# Patient Record
Sex: Female | Born: 1965 | Race: White | Hispanic: No | Marital: Married | Smoking: Never smoker
Health system: Southern US, Community
[De-identification: ages and names within clinical notes are randomized; demographics above are authoritative.]

## PROBLEM LIST (undated history)

## (undated) DIAGNOSIS — F410 Panic disorder [episodic paroxysmal anxiety] without agoraphobia: Secondary | ICD-10-CM

## (undated) DIAGNOSIS — I10 Essential (primary) hypertension: Secondary | ICD-10-CM

## (undated) DIAGNOSIS — T8859XA Other complications of anesthesia, initial encounter: Secondary | ICD-10-CM

## (undated) HISTORY — PX: WISDOM TOOTH EXTRACTION: SHX21

---

## 2019-12-16 ENCOUNTER — Ambulatory Visit: Payer: Self-pay | Attending: Internal Medicine

## 2019-12-16 DIAGNOSIS — Z23 Encounter for immunization: Secondary | ICD-10-CM

## 2019-12-16 NOTE — Progress Notes (Signed)
   Covid-19 Vaccination Clinic  Name:  Makena Murdock    MRN: [id number] DOB: 1966-06-25  12/16/2019  Ms. Towe was observed post Covid-19 immunization for 15 minutes without incident. She was provided with Vaccine Information Sheet and instruction to access the V-Safe system.   Ms. Hach was instructed to call 911 with any severe reactions post vaccine: Marland Kitchen Difficulty breathing  . Swelling of face and throat  . A fast heartbeat  . A bad rash all over body  . Dizziness and weakness   Immunizations Administered    Name Date Dose VIS Date Route   Pfizer COVID-19 Vaccine 12/16/2019  3:55 PM 0.3 mL 09/10/2019 Intramuscular   Manufacturer: [employer], [employer]   Lot: [id number]   NDC: [address]-3

## 2020-01-11 ENCOUNTER — Ambulatory Visit: Payer: Self-pay | Attending: Internal Medicine

## 2020-01-11 DIAGNOSIS — Z23 Encounter for immunization: Secondary | ICD-10-CM

## 2020-01-11 NOTE — Progress Notes (Signed)
   Covid-19 Vaccination Clinic  Name:  Kristy Ray    MRN: [id number] DOB: [birth year]  01/11/2020  Kristy Ray was observed post Covid-19 immunization for 15 minutes without incident. She was provided with Vaccine Information Sheet and instruction to access the V-Safe system.   Kristy Ray was instructed to call 911 with any severe reactions post vaccine: Marland Kitchen Difficulty breathing  . Swelling of face and throat  . A fast heartbeat  . A bad rash all over body  . Dizziness and weakness   Immunizations Administered    Name Date Dose VIS Date Route   Pfizer COVID-19 Vaccine 01/11/2020  3:44 PM 0.3 mL 09/10/2019 Intramuscular   Manufacturer: [employer], [employer]   Lot: [id number]   NDC: [address]-8

## 2020-05-16 ENCOUNTER — Other Ambulatory Visit: Payer: Self-pay | Admitting: Obstetrics and Gynecology

## 2020-05-16 DIAGNOSIS — R928 Other abnormal and inconclusive findings on diagnostic imaging of breast: Secondary | ICD-10-CM

## 2020-05-22 ENCOUNTER — Other Ambulatory Visit: Payer: Self-pay | Admitting: Obstetrics and Gynecology

## 2020-05-22 ENCOUNTER — Ambulatory Visit
Admission: RE | Admit: 2020-05-22 | Discharge: 2020-05-22 | Disposition: A | Discharge status: Home / Self Care | Payer: 59 | Source: Ambulatory Visit | Attending: Obstetrics and Gynecology | Admitting: Obstetrics and Gynecology

## 2020-05-22 ENCOUNTER — Other Ambulatory Visit: Payer: Self-pay

## 2020-05-22 DIAGNOSIS — R928 Other abnormal and inconclusive findings on diagnostic imaging of breast: Secondary | ICD-10-CM

## 2020-05-22 DIAGNOSIS — R921 Mammographic calcification found on diagnostic imaging of breast: Secondary | ICD-10-CM

## 2020-05-31 ENCOUNTER — Ambulatory Visit
Admission: RE | Admit: 2020-05-31 | Discharge: 2020-05-31 | Disposition: A | Discharge status: Home / Self Care | Payer: 59 | Source: Ambulatory Visit | Attending: Obstetrics and Gynecology | Admitting: Obstetrics and Gynecology

## 2020-05-31 ENCOUNTER — Other Ambulatory Visit: Payer: Self-pay

## 2020-05-31 DIAGNOSIS — R921 Mammographic calcification found on diagnostic imaging of breast: Secondary | ICD-10-CM

## 2020-05-31 HISTORY — PX: BREAST BIOPSY: SHX20

## 2020-06-12 ENCOUNTER — Other Ambulatory Visit: Payer: Self-pay | Admitting: Obstetrics and Gynecology

## 2020-06-12 DIAGNOSIS — Z803 Family history of malignant neoplasm of breast: Secondary | ICD-10-CM

## 2020-09-14 ENCOUNTER — Other Ambulatory Visit: Payer: Self-pay

## 2020-09-14 ENCOUNTER — Ambulatory Visit
Admission: RE | Admit: 2020-09-14 | Discharge: 2020-09-14 | Disposition: A | Discharge status: Home / Self Care | Payer: 59 | Source: Ambulatory Visit | Attending: Obstetrics and Gynecology | Admitting: Obstetrics and Gynecology

## 2020-09-14 DIAGNOSIS — Z803 Family history of malignant neoplasm of breast: Secondary | ICD-10-CM

## 2020-09-14 MED ORDER — GADOBUTROL 1 MMOL/ML IV SOLN
7.0000 mL | Freq: Once | INTRAVENOUS | Status: AC | PRN
  Administered 2020-09-14: 7 mL via INTRAVENOUS

## 2020-09-28 ENCOUNTER — Other Ambulatory Visit: Payer: 59

## 2021-07-30 IMAGING — MG MM BREAST BX W/ LOC DEV 1ST LESION IMAGE BX SPEC STEREO GUIDE*R*
8 of 11 series · 8 of 23 positions shown · non-contrast
Comparison: Previous exams.
COMPARISON: Previous exams.

Addendum:
CLINICAL DATA: 1.9 cm group of calcifications in the posterior
aspect of the upper-outer right breast at recent mammography.

EXAM:
RIGHT BREAST STEREOTACTIC CORE NEEDLE BIOPSY

[R (1 of 5)]
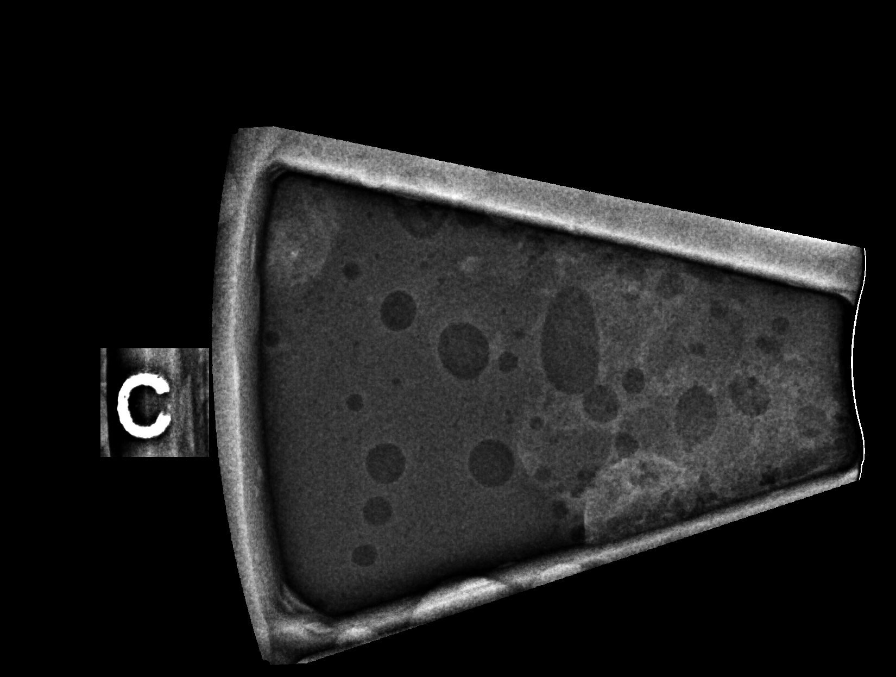

[R (2 of 5)]
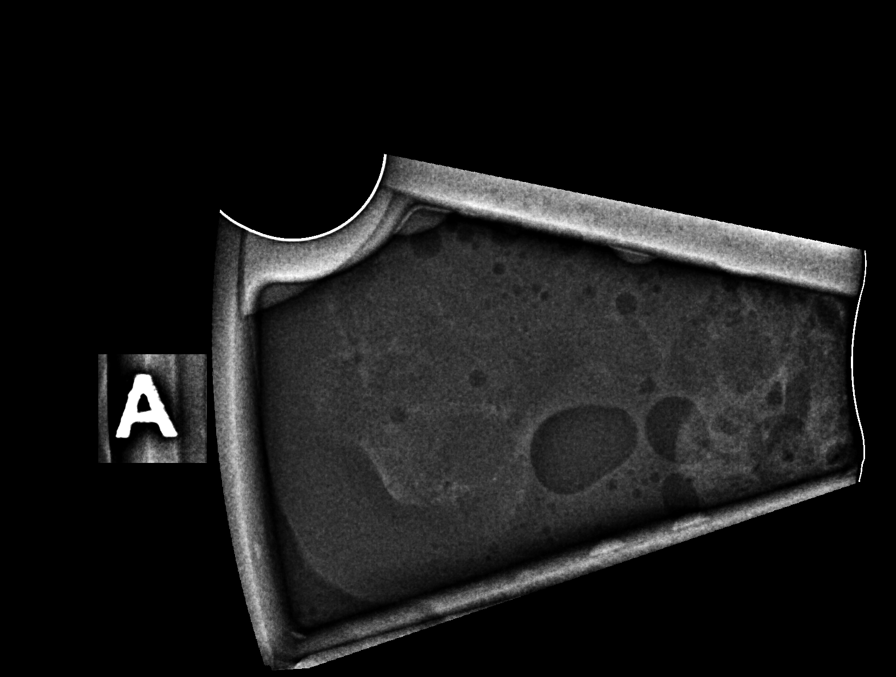

[R (3 of 5)]
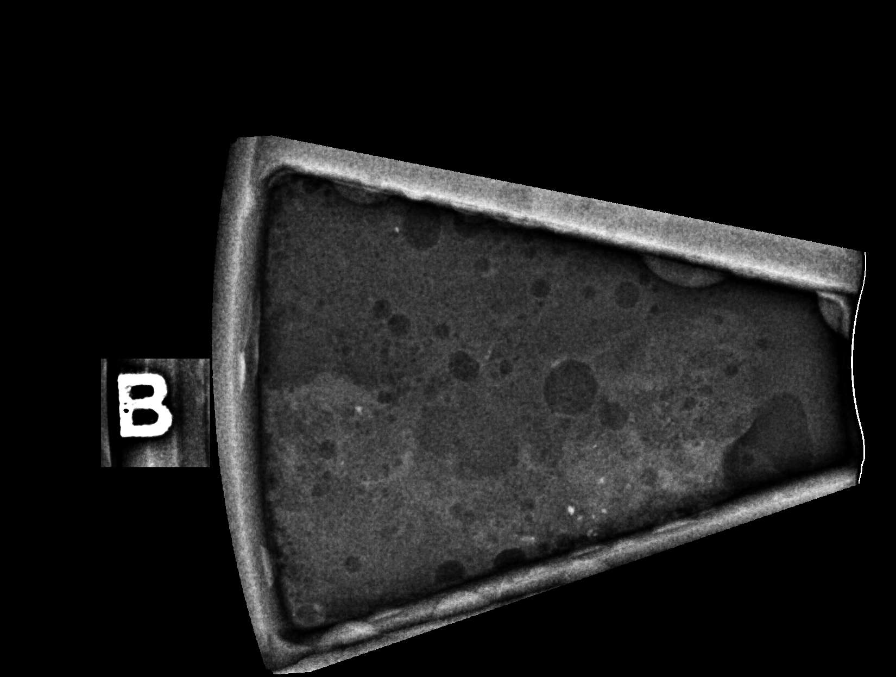

[R (4 of 5)]
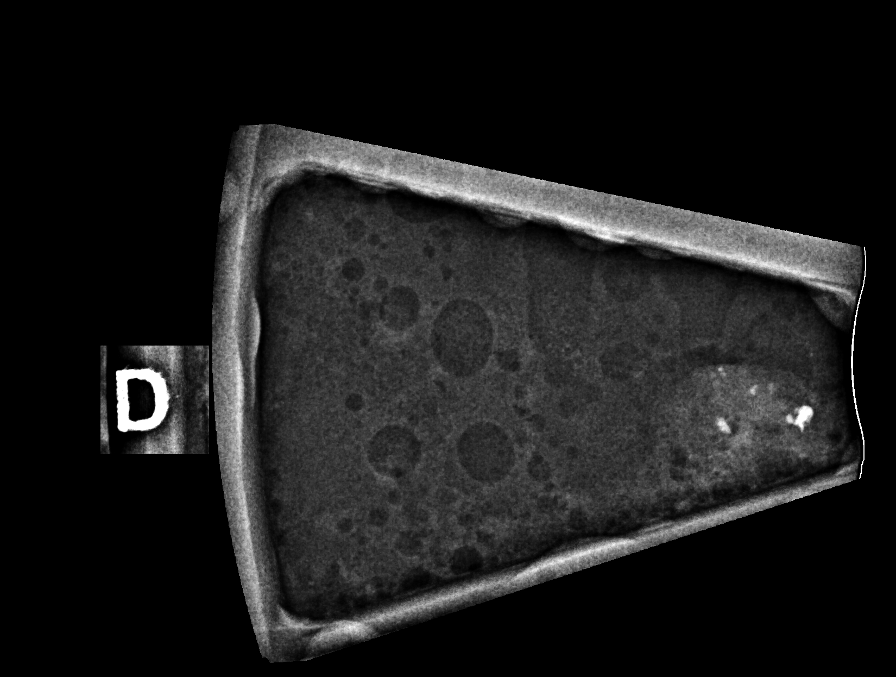

[R (5 of 5)]
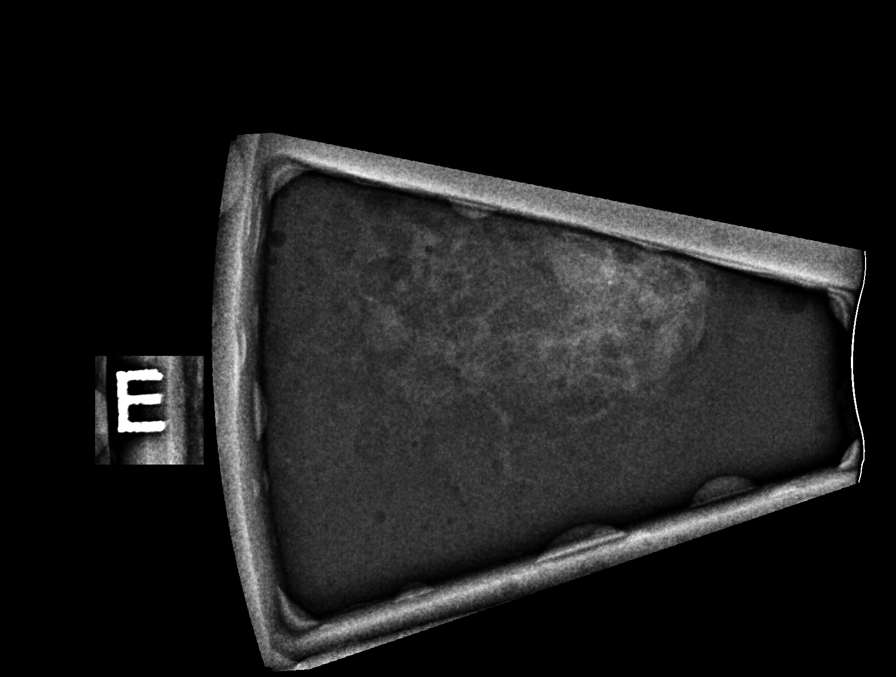

[R LM (1 of 3)]
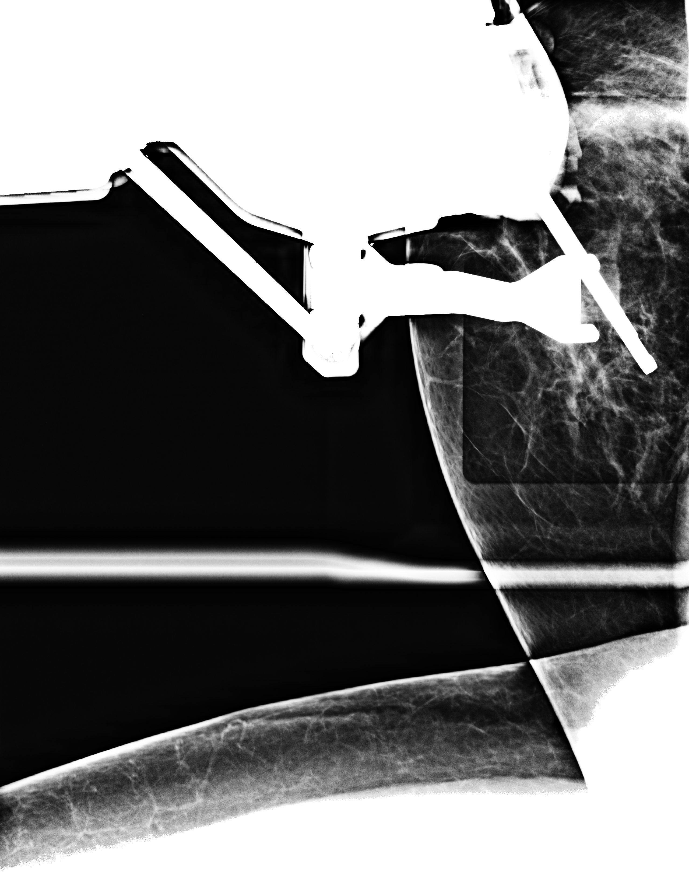

[R LM (2 of 3)]
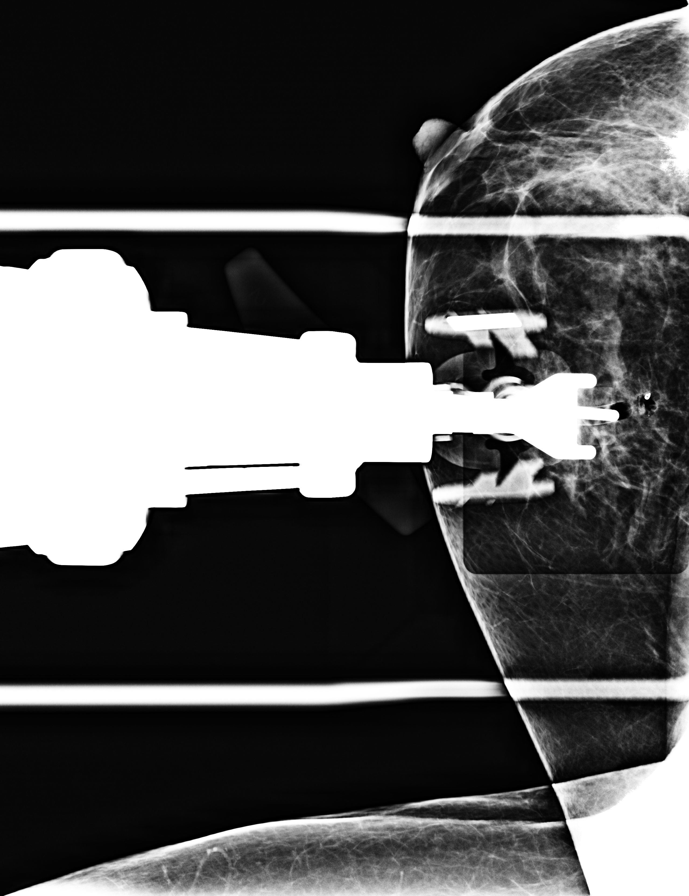

[R LM (3 of 3)]
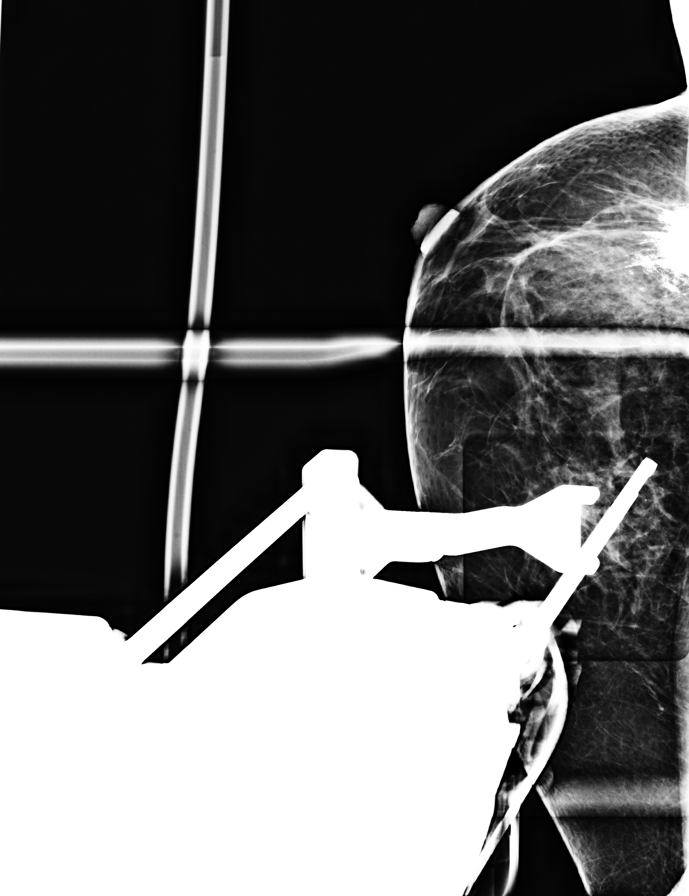

[8 of 23 positions shown; findings below may reference images not displayed]



Using sterile technique and 1% Lidocaine as local anesthetic, under
stereotactic guidance, a 9 gauge vacuum assisted device was used to
perform core needle biopsy of the recently demonstrated 1.9 cm group
calcifications in the upper-outer quadrant of the right breast using
a lateral approach. Specimen radiograph was performed showing
multiple calcifications in multiple specimens. Specimens with
calcifications are identified for pathology.

Lesion quadrant: Upper outer quadrant

At the conclusion of the procedure, a coil shaped tissue marker clip
was deployed into the biopsy cavity. Follow-up 2-view mammogram was
performed and dictated separately.
IMPRESSION: Stereotactic-guided biopsy of the recently demonstrated 1.9 cm
calcifications in the upper outer quadrant of the right breast. No
apparent complications.

ADDENDUM:
Pathology revealed FIBROCYSTIC CHANGE, INCLUDING COLUMNAR CELL
CHANGE, CALCIFICATIONS of the Right breast, upper outer quadrant.
This was found to be concordant by Dr. Niketa Villasenor.

Pathology results were discussed with the patient and her husband by
telephone. The patient reported doing well after the biopsy with
tenderness at the site. Post biopsy instructions and care were
reviewed and questions were answered. The patient was encouraged to
call The [REDACTED] for any additional
concerns. My direct phone number was provided.

The patient was instructed to return for annual screening

Pathology results reported by Vishwam Lope, RN on 06/02/2020.



Using sterile technique and 1% Lidocaine as local anesthetic, under
stereotactic guidance, a 9 gauge vacuum assisted device was used to
perform core needle biopsy of the recently demonstrated 1.9 cm group
calcifications in the upper-outer quadrant of the right breast using
a lateral approach. Specimen radiograph was performed showing
multiple calcifications in multiple specimens. Specimens with
calcifications are identified for pathology.

Lesion quadrant: Upper outer quadrant

At the conclusion of the procedure, a coil shaped tissue marker clip
was deployed into the biopsy cavity. Follow-up 2-view mammogram was
performed and dictated separately.
IMPRESSION: Stereotactic-guided biopsy of the recently demonstrated 1.9 cm
calcifications in the upper outer quadrant of the right breast. No
apparent complications.

## 2021-09-17 ENCOUNTER — Other Ambulatory Visit: Payer: Self-pay | Admitting: Nurse Practitioner

## 2021-09-17 DIAGNOSIS — Z803 Family history of malignant neoplasm of breast: Secondary | ICD-10-CM

## 2021-11-30 ENCOUNTER — Other Ambulatory Visit: Payer: 59

## 2021-12-17 ENCOUNTER — Ambulatory Visit
Admission: RE | Admit: 2021-12-17 | Discharge: 2021-12-17 | Disposition: A | Discharge status: Home / Self Care | Payer: 59 | Source: Ambulatory Visit | Attending: Nurse Practitioner | Admitting: Nurse Practitioner

## 2021-12-17 ENCOUNTER — Other Ambulatory Visit: Payer: Self-pay

## 2021-12-17 DIAGNOSIS — Z803 Family history of malignant neoplasm of breast: Secondary | ICD-10-CM

## 2021-12-17 MED ORDER — GADOBUTROL 1 MMOL/ML IV SOLN
9.0000 mL | Freq: Once | INTRAVENOUS | Status: AC | PRN
  Administered 2021-12-17: 9 mL via INTRAVENOUS

## 2022-08-13 LAB — EXTERNAL GENERIC LAB PROCEDURE: COLOGUARD: NEGATIVE

## 2022-08-13 LAB — COLOGUARD: COLOGUARD: NEGATIVE

## 2022-12-09 ENCOUNTER — Other Ambulatory Visit: Payer: Self-pay | Admitting: Obstetrics and Gynecology

## 2022-12-09 DIAGNOSIS — Z01818 Encounter for other preprocedural examination: Secondary | ICD-10-CM

## 2022-12-31 ENCOUNTER — Encounter (HOSPITAL_BASED_OUTPATIENT_CLINIC_OR_DEPARTMENT_OTHER): Payer: Self-pay | Admitting: Obstetrics and Gynecology

## 2022-12-31 NOTE — Progress Notes (Signed)
Spoke w/ via phone for pre-op interview--- Kristy Ray Lab needs dos---- EKG and ISTAT per anesthesia. CBC and T&S per surgeon              Lab results------ COVID test -----patient states asymptomatic no test needed Arrive at -------0630 NPO after MN NO Solid Food.   Med rec completed Medications to take morning of surgery -----NONE Diabetic medication ----- Patient instructed no nail polish to be worn day of surgery Patient instructed to bring photo id and insurance card day of surgery Patient aware to have Driver (ride ) / caregiver Husband Kristy Ray   for 24 hours after surgery  Patient Special Instructions ----- Pre-Op special Istructions ----- Patient verbalized understanding of instructions that were given at this phone interview. Patient denies shortness of breath, chest pain, fever, cough at this phone interview.  Patient does not know medication names or dosages. Patient to call back prior to surgery with medication information.

## 2023-01-06 NOTE — H&P (Signed)
57 y.o. with PMP bleeding with possible polyp and SUI.    Previously:"Consult for PMP bleeding, possible polyp and still having PMP sx. Not on highest dose of patch.  Breast Ca risk is elevated by family hx, rec yearly MRIs with mammos.  PT did have FBW in Oct 2023. Need records. Reviewed records.   Likely had some cough variant asthma- will start on advair and give MDI.  Menopausal sx: consider changing to po and starting with 0.5 mg dose. Keep prometrium same. Will change to pill now.  Not having strict depression sx but if not getting better with new dose of HRT, consider prozac. D/c thick and irritating outside, but having some AWL around clitoris and also perineum. Desitin for 4 weeks. DNA tests sent. Recheck vulva in 4 weeks, consider bx. MRIs of breast in Nov.  SUI - pt very interested in TVT, no uterine prolapse. In that case will do TVT, A&P repair and cysto with D&C, hysteroscopy, [facility]pectomy at [facility] for pmp bleeding second wed of May."  F/U "Pt. here for recheck and pre op visit; recheck on AWL around clitoris and also perineum, has been using Clobetasol since last visit (had Rx from another provider), wasn't aware that she should only use 2 weeks at a time; TVT/A repair/cystoscopy/d&C, hysteroscopy, polypectomy scheduled on 04/10; had TFT done here on 03/21; finishing up Estradiol patches and will then switch to PO Estradiol, on Progesterone also/tb//  TFTs were normal. 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  For TVT/A repair and cystoscopy with d&c, hysteroscopy and polypectomy.  Pt feels like has both urge incontinence after all and was trying Azo Bladder Control. Does have some splinting on occ but very occ. "    Past Medical History:  Diagnosis Date   Hypertension    Past Surgical History:  Procedure Laterality Date   WISDOM TOOTH EXTRACTION      Social History   Socioeconomic History   Marital status: Married    Spouse name: Not on file   Number of  children: Not on file   Years of education: Not on file   Highest education level: Not on file  Occupational History   Not on file  Tobacco Use   Smoking status: Never   Smokeless tobacco: Never  Vaping Use   Vaping Use: Never used  Substance and Sexual Activity   Alcohol use: Yes    Comment: Occ   Drug use: Never   Sexual activity: Not on file  Other Topics Concern   Not on file  Social History Narrative   Not on file   Social Determinants of Health   Financial Resource Strain: Not on file  Food Insecurity: Not on file  Transportation Needs: Not on file  Physical Activity: Not on file  Stress: Not on file  Social Connections: Not on file  Intimate Partner Violence: Not on file    No current facility-administered medications on file prior to encounter.   No current outpatient medications on file prior to encounter.    Allergies  Allergen Reactions   Sulfa Antibiotics Hives and Rash    Vitals:   12/31/22 0944  Weight: 81.6 kg  Height: 5\' 6"  (1.676 m)    Lungs: clear to ascultation Cor:  RRR Abdomen:  soft, nontender, nondistended. Ex:  no cords, erythema Pelvic:   Vulva: no masses, no atrophy, no lesions Vagina: no tenderness, no erythema, no abnormal vaginal discharge, no vesicle(s) or ulcers, cystocele (0), rectocele (mild, small, -2) Cervix:  grossly normal, no discharge, no cervical motion tenderness Uterus: normal size (7), normal shape, midline, no uterine prolapse, non-tender Bladder/Urethra: no urethral discharge, no urethral mass, bladder non distended, Urethra hypermobile (>>45) Adnexa/Parametria: no parametrial tenderness, no parametrial mass, no adnexal tenderness, no ovarian mass   A:  For D&C, hysteroscopy, polypectomy, TVT, A repair and cysto.    P: P: All risks, benefits and alternatives d/w patient and she desires to proceed.  Patient has undergone an ERAS protocol and will receive preop antibiotics and SCDs during the operation.   Pt will  go home same day if eating, ambulating, voiding and pain control is good.  Voiding trial before d/c.  Loney Laurence

## 2023-01-08 ENCOUNTER — Other Ambulatory Visit: Payer: Self-pay

## 2023-01-08 ENCOUNTER — Ambulatory Visit (HOSPITAL_BASED_OUTPATIENT_CLINIC_OR_DEPARTMENT_OTHER): Payer: BC Managed Care – PPO | Admitting: Anesthesiology

## 2023-01-08 ENCOUNTER — Encounter (HOSPITAL_BASED_OUTPATIENT_CLINIC_OR_DEPARTMENT_OTHER): Payer: Self-pay | Admitting: Obstetrics and Gynecology

## 2023-01-08 ENCOUNTER — Ambulatory Visit (HOSPITAL_BASED_OUTPATIENT_CLINIC_OR_DEPARTMENT_OTHER)
Admission: RE | Admit: 2023-01-08 | Discharge: 2023-01-08 | Disposition: A | Discharge status: Home / Self Care | Payer: BC Managed Care – PPO | Attending: Obstetrics and Gynecology | Admitting: Obstetrics and Gynecology

## 2023-01-08 ENCOUNTER — Encounter (HOSPITAL_BASED_OUTPATIENT_CLINIC_OR_DEPARTMENT_OTHER)
Admission: RE | Disposition: A | Discharge status: Home / Self Care | Payer: Self-pay | Source: Home / Self Care | Attending: Obstetrics and Gynecology

## 2023-01-08 DIAGNOSIS — D250 Submucous leiomyoma of uterus: Secondary | ICD-10-CM | POA: Diagnosis present

## 2023-01-08 DIAGNOSIS — I10 Essential (primary) hypertension: Secondary | ICD-10-CM

## 2023-01-08 DIAGNOSIS — N393 Stress incontinence (female) (male): Secondary | ICD-10-CM | POA: Insufficient documentation

## 2023-01-08 DIAGNOSIS — F419 Anxiety disorder, unspecified: Secondary | ICD-10-CM | POA: Diagnosis not present

## 2023-01-08 DIAGNOSIS — Z01818 Encounter for other preprocedural examination: Secondary | ICD-10-CM

## 2023-01-08 HISTORY — PX: CYSTOCELE REPAIR: SHX163

## 2023-01-08 HISTORY — PX: DILATATION & CURETTAGE/HYSTEROSCOPY WITH MYOSURE: SHX6511

## 2023-01-08 HISTORY — PX: CYSTOSCOPY: SHX5120

## 2023-01-08 HISTORY — DX: Panic disorder (episodic paroxysmal anxiety): F41.0

## 2023-01-08 HISTORY — PX: BLADDER SUSPENSION: SHX72

## 2023-01-08 HISTORY — DX: Other complications of anesthesia, initial encounter: T88.59XA

## 2023-01-08 HISTORY — DX: Essential (primary) hypertension: I10

## 2023-01-08 LAB — TYPE AND SCREEN
ABO/RH(D): O POS
Antibody Screen: NEGATIVE

## 2023-01-08 LAB — CBC
HCT: 40.4 % (ref 36.0–46.0)
Hemoglobin: 13.5 g/dL (ref 12.0–15.0)
MCH: 31.9 pg (ref 26.0–34.0)
MCHC: 33.4 g/dL (ref 30.0–36.0)
MCV: 95.5 fL (ref 80.0–100.0)
Platelets: 264 10*3/uL (ref 150–400)
RBC: 4.23 MIL/uL (ref 3.87–5.11)
RDW: 12.3 % (ref 11.5–15.5)
WBC: 5.7 10*3/uL (ref 4.0–10.5)
nRBC: 0 % (ref 0.0–0.2)

## 2023-01-08 LAB — POCT I-STAT, CHEM 8
BUN: 21 mg/dL — ABNORMAL HIGH (ref 6–20)
Calcium, Ion: 1.17 mmol/L (ref 1.15–1.40)
Chloride: 106 mmol/L (ref 98–111)
Creatinine, Ser: 0.8 mg/dL (ref 0.44–1.00)
Glucose, Bld: 98 mg/dL (ref 70–99)
HCT: 42 % (ref 36.0–46.0)
Hemoglobin: 14.3 g/dL (ref 12.0–15.0)
Potassium: 4.2 mmol/L (ref 3.5–5.1)
Sodium: 140 mmol/L (ref 135–145)
TCO2: 24 mmol/L (ref 22–32)

## 2023-01-08 LAB — ABO/RH: ABO/RH(D): O POS

## 2023-01-08 SURGERY — URETHROPEXY, USING TRANSVAGINAL TAPE
Anesthesia: General | Site: Vagina

## 2023-01-08 MED ORDER — SCOPOLAMINE 1 MG/3DAYS TD PT72
MEDICATED_PATCH | TRANSDERMAL | Status: DC | PRN
  Administered 2023-01-08: 1 via TRANSDERMAL

## 2023-01-08 MED ORDER — PROPOFOL 10 MG/ML IV BOLUS
INTRAVENOUS | Status: DC | PRN
  Administered 2023-01-08: 180 mg via INTRAVENOUS

## 2023-01-08 MED ORDER — MIDAZOLAM HCL 5 MG/5ML IJ SOLN
INTRAMUSCULAR | Status: DC | PRN
  Administered 2023-01-08: 2 mg via INTRAVENOUS

## 2023-01-08 MED ORDER — PROPOFOL 10 MG/ML IV BOLUS
INTRAVENOUS | Status: AC
  Filled 2023-01-08: qty 20

## 2023-01-08 MED ORDER — PHENYLEPHRINE 80 MCG/ML (10ML) SYRINGE FOR IV PUSH (FOR BLOOD PRESSURE SUPPORT)
PREFILLED_SYRINGE | INTRAVENOUS | Status: AC
  Filled 2023-01-08: qty 10

## 2023-01-08 MED ORDER — OXYCODONE-ACETAMINOPHEN 5-325 MG PO TABS: 1.0000 | ORAL_TABLET | ORAL | 0 refills | Status: AC | PRN

## 2023-01-08 MED ORDER — DEXAMETHASONE SODIUM PHOSPHATE 10 MG/ML IJ SOLN
INTRAMUSCULAR | Status: DC | PRN
  Administered 2023-01-08: 10 mg via INTRAVENOUS

## 2023-01-08 MED ORDER — OXYCODONE HCL 5 MG/5ML PO SOLN: 5.0000 mg | Freq: Once | ORAL | Status: AC | PRN

## 2023-01-08 MED ORDER — SCOPOLAMINE 1 MG/3DAYS TD PT72
MEDICATED_PATCH | TRANSDERMAL | Status: AC
  Filled 2023-01-08: qty 1

## 2023-01-08 MED ORDER — LIDOCAINE-EPINEPHRINE (PF) 1 %-1:200000 IJ SOLN
INTRAMUSCULAR | Status: DC | PRN
  Administered 2023-01-08: 10 mL

## 2023-01-08 MED ORDER — POVIDONE-IODINE 10 % EX SWAB: 2.0000 | Freq: Once | CUTANEOUS | Status: DC

## 2023-01-08 MED ORDER — OXYCODONE HCL 5 MG PO TABS
ORAL_TABLET | ORAL | Status: AC
  Filled 2023-01-08: qty 1

## 2023-01-08 MED ORDER — DEXMEDETOMIDINE HCL IN NACL 80 MCG/20ML IV SOLN
INTRAVENOUS | Status: DC | PRN
  Administered 2023-01-08 (×3): 4 ug via BUCCAL

## 2023-01-08 MED ORDER — LACTATED RINGERS IV SOLN
INTRAVENOUS | Status: DC
  Administered 2023-01-08: 1000 mL via INTRAVENOUS

## 2023-01-08 MED ORDER — DEXMEDETOMIDINE HCL IN NACL 80 MCG/20ML IV SOLN
INTRAVENOUS | Status: AC
  Filled 2023-01-08: qty 20

## 2023-01-08 MED ORDER — LIDOCAINE HCL (PF) 2 % IJ SOLN
INTRAMUSCULAR | Status: AC
  Filled 2023-01-08: qty 5

## 2023-01-08 MED ORDER — MIDAZOLAM HCL 2 MG/2ML IJ SOLN
INTRAMUSCULAR | Status: AC
  Filled 2023-01-08: qty 2

## 2023-01-08 MED ORDER — LIDOCAINE-EPINEPHRINE (PF) 1 %-1:200000 IJ SOLN
INTRAMUSCULAR | Status: AC
  Filled 2023-01-08: qty 30

## 2023-01-08 MED ORDER — ONDANSETRON HCL 4 MG/2ML IJ SOLN: 4.0000 mg | Freq: Four times a day (QID) | INTRAMUSCULAR | Status: DC | PRN

## 2023-01-08 MED ORDER — OXYCODONE HCL 5 MG PO TABS
5.0000 mg | ORAL_TABLET | Freq: Once | ORAL | Status: AC | PRN
  Administered 2023-01-08: 5 mg via ORAL

## 2023-01-08 MED ORDER — FLUORESCEIN SODIUM 10 % IV SOLN
INTRAVENOUS | Status: AC
  Filled 2023-01-08: qty 5

## 2023-01-08 MED ORDER — SODIUM CHLORIDE 0.9 % IR SOLN
Status: DC | PRN
  Administered 2023-01-08: 450 mL

## 2023-01-08 MED ORDER — FLUORESCEIN SODIUM 10 % IV SOLN
INTRAVENOUS | Status: DC | PRN
  Administered 2023-01-08: 1 mL via INTRAVENOUS

## 2023-01-08 MED ORDER — ESTRADIOL 0.1 MG/GM VA CREA
TOPICAL_CREAM | VAGINAL | Status: AC
  Filled 2023-01-08: qty 42.5

## 2023-01-08 MED ORDER — LIDOCAINE 2% (20 MG/ML) 5 ML SYRINGE
INTRAMUSCULAR | Status: DC | PRN
  Administered 2023-01-08: 60 mg via INTRAVENOUS

## 2023-01-08 MED ORDER — ONDANSETRON HCL 4 MG/2ML IJ SOLN
INTRAMUSCULAR | Status: AC
  Filled 2023-01-08: qty 2

## 2023-01-08 MED ORDER — LACTATED RINGERS IV SOLN: INTRAVENOUS | Status: DC

## 2023-01-08 MED ORDER — ESTRADIOL 0.1 MG/GM VA CREA
TOPICAL_CREAM | VAGINAL | Status: DC | PRN
  Administered 2023-01-08: 1 via VAGINAL

## 2023-01-08 MED ORDER — FENTANYL CITRATE (PF) 100 MCG/2ML IJ SOLN
25.0000 ug | INTRAMUSCULAR | Status: DC | PRN
  Administered 2023-01-08 (×2): 25 ug via INTRAVENOUS

## 2023-01-08 MED ORDER — METHYLENE BLUE 1 % INJ SOLN
INTRAVENOUS | Status: AC
  Filled 2023-01-08: qty 10

## 2023-01-08 MED ORDER — DEXAMETHASONE SODIUM PHOSPHATE 10 MG/ML IJ SOLN
INTRAMUSCULAR | Status: AC
  Filled 2023-01-08: qty 1

## 2023-01-08 MED ORDER — FENTANYL CITRATE (PF) 100 MCG/2ML IJ SOLN
INTRAMUSCULAR | Status: AC
  Filled 2023-01-08: qty 2

## 2023-01-08 MED ORDER — FENTANYL CITRATE (PF) 100 MCG/2ML IJ SOLN
INTRAMUSCULAR | Status: DC | PRN
  Administered 2023-01-08: 50 ug via INTRAVENOUS
  Administered 2023-01-08 (×2): 25 ug via INTRAVENOUS

## 2023-01-08 MED ORDER — SOD CITRATE-CITRIC ACID 500-334 MG/5ML PO SOLN: 30.0000 mL | ORAL | Status: DC

## 2023-01-08 MED ORDER — PHENYLEPHRINE 80 MCG/ML (10ML) SYRINGE FOR IV PUSH (FOR BLOOD PRESSURE SUPPORT)
PREFILLED_SYRINGE | INTRAVENOUS | Status: DC | PRN
  Administered 2023-01-08 (×3): 80 ug via INTRAVENOUS

## 2023-01-08 MED ORDER — HEMOSTATIC AGENTS (NO CHARGE) OPTIME
TOPICAL | Status: DC | PRN
  Administered 2023-01-08: 1 via TOPICAL

## 2023-01-08 MED ORDER — CEFAZOLIN SODIUM-DEXTROSE 2-4 GM/100ML-% IV SOLN
INTRAVENOUS | Status: AC
  Filled 2023-01-08: qty 100

## 2023-01-08 MED ORDER — ONDANSETRON HCL 4 MG/2ML IJ SOLN
INTRAMUSCULAR | Status: DC | PRN
  Administered 2023-01-08: 4 mg via INTRAVENOUS

## 2023-01-08 MED ORDER — CEFAZOLIN SODIUM-DEXTROSE 2-4 GM/100ML-% IV SOLN
2.0000 g | INTRAVENOUS | Status: AC
  Administered 2023-01-08: 2 g via INTRAVENOUS

## 2023-01-08 SURGICAL SUPPLY — 41 items
ADH SKN CLS APL DERMABOND .7 (GAUZE/BANDAGES/DRESSINGS) ×3
AGENT HMST KT MTR STRL THRMB (HEMOSTASIS) ×3
BLADE SURG 15 STRL LF DISP TIS (BLADE) ×6 IMPLANT
BLADE SURG 15 STRL SS (BLADE) ×6
CATH ROBINSON RED A/P 16FR (CATHETERS) ×3 IMPLANT
CNTNR URN SCR LID CUP LEK RST (MISCELLANEOUS) IMPLANT
CONT SPEC 4OZ STRL OR WHT (MISCELLANEOUS) ×3
DERMABOND ADVANCED .7 DNX12 (GAUZE/BANDAGES/DRESSINGS) ×3 IMPLANT
DEVICE MYOSURE LITE (MISCELLANEOUS) IMPLANT
DEVICE MYOSURE REACH (MISCELLANEOUS) IMPLANT
DRSG TELFA 3X8 NADH STRL (GAUZE/BANDAGES/DRESSINGS) ×3 IMPLANT
GAUZE 4X4 16PLY ~~LOC~~+RFID DBL (SPONGE) ×6 IMPLANT
GAUZE STRIP PACKING 2INX5YD (MISCELLANEOUS) ×3 IMPLANT
GLOVE BIO SURGEON STRL SZ7 (GLOVE) ×3 IMPLANT
GLOVE BIOGEL PI IND STRL 7.0 (GLOVE) ×6 IMPLANT
GOWN STRL REUS W/TWL LRG LVL3 (GOWN DISPOSABLE) ×9 IMPLANT
KIT PROCEDURE FLUENT (KITS) ×3 IMPLANT
KIT TURNOVER CYSTO (KITS) ×3 IMPLANT
MARKER SKIN DUAL TIP RULER LAB (MISCELLANEOUS) IMPLANT
MYOSURE XL FIBROID (MISCELLANEOUS)
NDL HYPO 22X1.5 SAFETY MO (MISCELLANEOUS) ×3 IMPLANT
NEEDLE HYPO 22X1.5 SAFETY MO (MISCELLANEOUS) ×3 IMPLANT
NS IRRIG 1000ML POUR BTL (IV SOLUTION) ×3 IMPLANT
PACK VAGINAL MINOR WOMEN LF (CUSTOM PROCEDURE TRAY) ×3 IMPLANT
PACK VAGINAL WOMENS (CUSTOM PROCEDURE TRAY) ×3 IMPLANT
PAD OB MATERNITY 4.3X12.25 (PERSONAL CARE ITEMS) ×3 IMPLANT
SEAL CERVICAL OMNI LOK (ABLATOR) IMPLANT
SEAL ROD LENS SCOPE MYOSURE (ABLATOR) ×3 IMPLANT
SET IRRIG Y TYPE TUR BLADDER L (SET/KITS/TRAYS/PACK) ×3 IMPLANT
SLEEVE SCD COMPRESS KNEE MED (STOCKING) ×3 IMPLANT
SLING UTERINE/ABD GYNECARE TVT (Sling) ×3 IMPLANT
SURGIFLO W/THROMBIN 8M KIT (HEMOSTASIS) IMPLANT
SUT VIC AB 0 CT1 18XCR BRD8 (SUTURE) ×3 IMPLANT
SUT VIC AB 0 CT1 8-18 (SUTURE) ×3
SUT VIC AB 2-0 CT1 (SUTURE) ×3 IMPLANT
SUT VIC AB 2-0 CT1 27 (SUTURE) ×3
SUT VIC AB 2-0 CT1 TAPERPNT 27 (SUTURE) ×3 IMPLANT
SUT VIC AB 2-0 UR6 27 (SUTURE) IMPLANT
SYSTEM TISS REMOVAL MYOSURE XL (MISCELLANEOUS) IMPLANT
TOWEL OR 17X24 6PK STRL BLUE (TOWEL DISPOSABLE) ×6 IMPLANT
TRAY FOLEY W/BAG SLVR 14FR LF (SET/KITS/TRAYS/PACK) ×3 IMPLANT

## 2023-01-08 NOTE — Anesthesia Procedure Notes (Signed)
Procedure Name: LMA Insertion Date/Time: 01/08/2023 8:36 AM  Performed by: Donnetta Gillin D, CRNAPre-anesthesia Checklist: Patient identified, Emergency Drugs available, Suction available and Patient being monitored Patient Re-evaluated:Patient Re-evaluated prior to induction Oxygen Delivery Method: Circle system utilized Preoxygenation: Pre-oxygenation with 100% oxygen Induction Type: IV induction Ventilation: Mask ventilation without difficulty LMA: LMA inserted LMA Size: 4.0 Tube type: Oral Number of attempts: 1 Placement Confirmation: positive ETCO2 and breath sounds checked- equal and bilateral Tube secured with: Tape Dental Injury: Teeth and Oropharynx as per pre-operative assessment

## 2023-01-08 NOTE — Progress Notes (Signed)
Instilled 300 ml NS to bladder and foley cath removed. Up to bathroom to void 200 ml without difficulty. PVR 15 ml.

## 2023-01-08 NOTE — Op Note (Addendum)
9:57 AM  PATIENT:  Kristy Ray  57 y.o. female  PRE-OPERATIVE DIAGNOSIS:  stress incontinence  POST-OPERATIVE DIAGNOSIS:  stress incontinence  PROCEDURE:  Procedure(s): TRANSVAGINAL TAPE (TVT) PROCEDURE (N/A) ANTERIOR REPAIR (CYSTOCELE) (N/A) DILATATION & CURETTAGE/HYSTEROSCOPY WITH MYOSURE (N/A) CYSTOSCOPY (N/A)  SURGEON:  Surgeon(s) and Role:    * Carrington Clamp, MD - Primary    * Charlett Nose, MD - Assisting  ANESTHESIA:   general  EBL:  150 mL   DRAINS: Urinary Catheter (Foley)   Hysteroscopic deficit was less than 150 cc.  LOCAL MEDICATIONS USED:  OTHER lidocaine with epi, surgiflo, estrace, fuorecein  SPECIMEN:  Source of Specimen:  uterine curretings, polypectomy  DISPOSITION OF SPECIMEN:  PATHOLOGY  COUNTS:  YES  TOURNIQUET:  * No tourniquets in log *  DICTATION: .Note written in EPIC  PLAN OF CARE: Discharge to home after PACU  PATIENT DISPOSITION:  PACU - hemodynamically stable.   Delay start of Pharmacological VTE agent (>24hrs) due to surgical blood loss or risk of bleeding: not applicable  Complications: none  Technique:  After adequate anesthesia was achieved, the patient was prepped and draped in the usual sterile fashion.  The foley was placed in the bladder.  The speculum was placed in the vagina and the cervix stabilized with a single-tooth tenaculum.  The cervix was dilated with pratt dilators and the hysteroscope passed inside the endometrial cavity.  The above findings were noted and polyp forceps were used to remove small pieces of tissue.  Sharp curettage was then performed and uterine curettings sent to path.  All instruments were removed from the vagina.    Attention was then turned to the vagina and cystocele.  A short duck bill retractor was placed and he apex of the cystocele grasped with two allises.  Allises were used to grasp the vaginal mucosa in the midline superior to inferior just below the urethral opening.  The  mucosa was injected with 1% lidocaine with epi in the midline and a scalpel used to incise the vaginal mucosa vertically.  Allises were used to retract the vaginal mucosa as the vesico-vaginal fascia was removed from the mucosa with blunt and sharp dissection and adequate room midurethral to pelvic floor bilaterally was developed.  Two small puncture incisions were then made two cm from midline just above the pubic symphysis.  The abdominal needles from the [employer]necare TVT set were introduced behind the pubic bone, through the pelvic floor and out the vagina carefully on each respective side, being careful to hug the posterior of the pubic bone.  The foley was removed and fluorecien given.  Cystoscopy revealed excellent bilateral spill from each ureteral orifice and NO NEEDLES IN THE BLADDER.   The urethra was clear as well.  The foley was replaced and the vaginal needles attached to the abdominal needles.  The tape was pulled into place through the abdominal incisions, the sheaths removed while a kelly was in place under the mesh sling and the tape cut at the level of just below the skin.  The tape was checked and found to be tight enough and laying flat.   Surgiflo was placed in the corners up by the pelvic floor to achieve hemostasis of some small bleeders out of reach and too close to the sling for stitches.  Once hemostasis was achieved, 3 mattress stitches of 0-vicryl were placed to close the vesico-vaginal fascia under the bladder.  The vaginal mucosa was trimmed and then closed with a running lock stitch  of 2-0 vicryl.  A vaginal packing with estrace was placed and the patient returned to the recovery room in stable condition.

## 2023-01-08 NOTE — Transfer of Care (Signed)
Immediate Anesthesia Transfer of Care Note  Patient: Kristy Ray  Procedure(s) Performed: TRANSVAGINAL TAPE (TVT) PROCEDURE (Vagina ) ANTERIOR REPAIR (CYSTOCELE) (Vagina ) DILATATION & CURETTAGE/HYSTEROSCOPY WITH MYOSURE (Uterus) CYSTOSCOPY (Bladder)  Patient Location: PACU  Anesthesia Type:General  Level of Consciousness: awake, alert , and oriented  Airway & Oxygen Therapy: Patient Spontanous Breathing and Patient connected to nasal cannula oxygen  Post-op Assessment: Report given to RN and Post -op Vital signs reviewed and stable  Post vital signs: Reviewed and stable  Last Vitals:  Vitals Value Taken Time  BP    Temp    Pulse    Resp    SpO2      Last Pain:  Vitals:   01/08/23 0721  TempSrc: Oral         Complications: No notable events documented.

## 2023-01-08 NOTE — Anesthesia Preprocedure Evaluation (Signed)
Anesthesia Evaluation  Patient identified by MRN, date of birth, ID band Patient awake    Reviewed: Allergy & Precautions, H&P , NPO status , Patient's Chart, lab work & pertinent test results  Airway Mallampati: II   Neck ROM: full    Dental   Pulmonary neg pulmonary ROS   breath sounds clear to auscultation       Cardiovascular hypertension,  Rhythm:regular Rate:Normal     Neuro/Psych  PSYCHIATRIC DISORDERS Anxiety        GI/Hepatic   Endo/Other    Renal/GU      Musculoskeletal   Abdominal   Peds  Hematology   Anesthesia Other Findings   Reproductive/Obstetrics                             Anesthesia Physical Anesthesia Plan  ASA: 2  Anesthesia Plan: General   Post-op Pain Management:    Induction: Intravenous  PONV Risk Score and Plan: 3 and Ondansetron, Dexamethasone, Midazolam and Treatment may vary due to age or medical condition  Airway Management Planned: Oral ETT  Additional Equipment:   Intra-op Plan:   Post-operative Plan: Extubation in OR  Informed Consent: I have reviewed the patients History and Physical, chart, labs and discussed the procedure including the risks, benefits and alternatives for the proposed anesthesia with the patient or authorized representative who has indicated his/her understanding and acceptance.     Dental advisory given  Plan Discussed with: CRNA, Anesthesiologist and Surgeon  Anesthesia Plan Comments:        Anesthesia Quick Evaluation

## 2023-01-08 NOTE — Discharge Instructions (Addendum)
Remove vaginal packing tomorrow after taking some pain meds.     Post Anesthesia Home Care Instructions  Activity: Get plenty of rest for the remainder of the day. A responsible individual must stay with you for 24 hours following the procedure.  For the next 24 hours, DO NOT: -Drive a car -Advertising copywriter -Drink alcoholic beverages -Take any medication unless instructed by your physician -Make any legal decisions or sign important papers.  Meals: Start with liquid foods such as gelatin or soup. Progress to regular foods as tolerated. Avoid greasy, spicy, heavy foods. If nausea and/or vomiting occur, drink only clear liquids until the nausea and/or vomiting subsides. Call your physician if vomiting continues.  Special Instructions/Symptoms: Your throat may feel dry or sore from the anesthesia or the breathing tube placed in your throat during surgery. If this causes discomfort, gargle with warm salt water. The discomfort should disappear within 24 hours.  If you had a scopolamine patch placed behind your ear for the management of post- operative nausea and/or vomiting:  1. The medication in the patch is effective for 72 hours, after which it should be removed.  Wrap patch in a tissue and discard in the trash. Wash hands thoroughly with soap and water. 2. You may remove the patch earlier than 72 hours if you experience unpleasant side effects which may include dry mouth, dizziness or visual disturbances. 3. Avoid touching the patch. Wash your hands with soap and water after contact with the patch.    Remove patch behind left ear by Saturday, April 13,2024.

## 2023-01-08 NOTE — Anesthesia Postprocedure Evaluation (Signed)
Anesthesia Post Note  Patient: Kristy Ray  Procedure(s) Performed: TRANSVAGINAL TAPE (TVT) PROCEDURE (Vagina ) ANTERIOR REPAIR (CYSTOCELE) (Vagina ) DILATATION & CURETTAGE/HYSTEROSCOPY WITH MYOSURE (Uterus) CYSTOSCOPY (Bladder)     Patient location during evaluation: PACU Anesthesia Type: General Level of consciousness: awake and alert Pain management: pain level controlled Vital Signs Assessment: post-procedure vital signs reviewed and stable Respiratory status: spontaneous breathing, nonlabored ventilation, respiratory function stable and patient connected to nasal cannula oxygen Cardiovascular status: blood pressure returned to baseline and stable Postop Assessment: no apparent nausea or vomiting Anesthetic complications: no   No notable events documented.  Last Vitals:  Vitals:   01/08/23 1045 01/08/23 1130  BP: 119/82 111/72  Pulse: 86 78  Resp: 12 14  Temp:  36.7 C  SpO2: 95% 99%    Last Pain:  Vitals:   01/08/23 1130  TempSrc:   PainSc: 4                  [name] S

## 2023-01-08 NOTE — Brief Op Note (Signed)
01/08/2023  9:57 AM  PATIENT:  Arrie Senate  57 y.o. female  PRE-OPERATIVE DIAGNOSIS:  stress incontinence  POST-OPERATIVE DIAGNOSIS:  stress incontinence  PROCEDURE:  Procedure(s): TRANSVAGINAL TAPE (TVT) PROCEDURE (N/A) ANTERIOR REPAIR (CYSTOCELE) (N/A) DILATATION & CURETTAGE/HYSTEROSCOPY WITH MYOSURE (N/A) CYSTOSCOPY (N/A)  SURGEON:  Surgeon(s) and Role:    * Carrington Clamp, MD - Primary    * Charlett Nose, MD - Assisting  ANESTHESIA:   general  EBL:  150 mL   DRAINS: Urinary Catheter (Foley)   LOCAL MEDICATIONS USED:  OTHER lidocaine with epi, surgiflo, estrace, fuorecein  SPECIMEN:  Source of Specimen:  uterine curretings, polypectomy  DISPOSITION OF SPECIMEN:  PATHOLOGY  COUNTS:  YES  TOURNIQUET:  * No tourniquets in log *  DICTATION: .Note written in EPIC  PLAN OF CARE: Discharge to home after PACU  PATIENT DISPOSITION:  PACU - hemodynamically stable.   Delay start of Pharmacological VTE agent (>24hrs) due to surgical blood loss or risk of bleeding: not applicable

## 2023-01-08 NOTE — Discharge Summary (Signed)
Physician Discharge Summary  Patient ID: Kristy Ray MRN: [id number] DOB/AGE: [birth year] 57 y.o.  Admit date: 01/08/2023 Discharge date: 01/08/2023  Admission Diagnoses:  Discharge Diagnoses:  Active Problems:   * No active hospital problems. *   Discharged Condition: good  Hospital Course: Uncomplicated hysteroscopy, d&c, polypectomy, TVT, A repair and cysto.   Consults: None  Significant Diagnostic Studies: labs: none  Treatments: surgery: Uncomplicated hysteroscopy, d&c, polypectomy, TVT, A repair and cysto.    Discharge Exam: Blood pressure 111/72, pulse 82, temperature 98.7 F (37.1 C), temperature source Oral, resp. rate 16, height 5\' 6"  (1.676 m), weight 83 kg, SpO2 96 %.   Disposition: Discharge disposition: 01-Home or Self Care       Discharge Instructions     Call MD for:  temperature >100.4   Complete by: As directed    Diet - low sodium heart healthy   Complete by: As directed    Discharge instructions   Complete by: As directed    No driving on narcotics, no sexual activity for 2 weeks.   Increase activity slowly   Complete by: As directed    May shower / Bathe   Complete by: As directed    Shower, no bath for 2 weeks.   Remove dressing in 24 hours   Complete by: As directed    Sexual Activity Restrictions   Complete by: As directed    No sexual activity for 2 weeks.      Allergies as of 01/08/2023       Reactions   Sulfa Antibiotics Hives, Rash        Medication List     TAKE these medications    amLODipine 10 MG tablet Commonly known as: NORVASC Take 10 mg by mouth daily.   estradiol 0.05 mg/24hr patch Commonly known as: CLIMARA - Dosed in mg/24 hr Place 0.05 mg onto the skin 2 (two) times a week.   LORazepam 1 MG tablet Commonly known as: ATIVAN Take 2 mg by mouth every 8 (eight) hours. May take 1-2 mg 15 min prior to event   losartan 25 MG tablet Commonly known as: COZAAR Take 25 mg by mouth daily.    oxyCODONE-acetaminophen 5-325 MG tablet Commonly known as: PERCOCET/ROXICET Take 1 tablet by mouth every 4 (four) hours as needed for severe pain.   progesterone 100 MG vaginal insert Commonly known as: ENDOMETRIN Place 100 mg vaginally 2 (two) times daily.        Follow-up Information     Carrington Clamp, MD Follow up in 2 week(s).   Specialty: Obstetrics and Gynecology Why: Unless goes home with cath, in which she should return in two days to office. Contact information: [address] [place] [address] [phone]                 Signed: Loney Laurence 01/08/2023, 10:12 AM

## 2023-01-08 NOTE — Progress Notes (Signed)
There has been no change in the patients history, status or exam since the history and physical.  Vitals:   12/31/22 0944 01/08/23 0721  BP:  111/72  Pulse:  82  Resp:  16  Temp:  98.7 F (37.1 C)  TempSrc:  Oral  SpO2:  96%  Weight: 81.6 kg 83 kg  Height: 5\' 6"  (1.676 m) 5\' 6"  (1.676 m)    Results for orders placed or performed during the hospital encounter of 01/08/23 (from the past 72 hour(s))  CBC     Status: None   Collection Time: 01/08/23  7:41 AM  Result Value Ref Range   WBC 5.7 4.0 - 10.5 K/uL   RBC 4.23 3.87 - 5.11 MIL/uL   Hemoglobin 13.5 12.0 - 15.0 g/dL   HCT 73.4 19.3 - 79.0 %   MCV 95.5 80.0 - 100.0 fL   MCH 31.9 26.0 - 34.0 pg   MCHC 33.4 30.0 - 36.0 g/dL   RDW 24.0 97.3 - 53.2 %   Platelets 264 150 - 400 K/uL   nRBC 0.0 0.0 - 0.2 %    Comment: Performed at [facility], [address] [place], [place] [address]  [address]ie [address]     Status: Abnormal   Collection Time: 01/08/23  7:55 AM  Result Value Ref Range   Sodium 140 135 - 145 mmol/L   Potassium 4.2 3.5 - 5.1 mmol/L   Chloride 106 98 - 111 mmol/L   BUN 21 (H) 6 - 20 mg/dL   Creatinine, Ser 6.83 0.44 - 1.00 mg/dL   Glucose, Bld 98 70 - 99 mg/dL    Comment: Glucose reference range applies only to samples taken after fasting for at least 8 hours.   Calcium, Ion 1.17 1.15 - 1.40 mmol/L   TCO2 24 22 - 32 mmol/L   Hemoglobin 14.3 12.0 - 15.0 g/dL   HCT 41.9 62.2 - 29.7 %    Loney Laurence

## 2023-01-09 LAB — SURGICAL PATHOLOGY

## 2023-01-10 ENCOUNTER — Encounter (HOSPITAL_BASED_OUTPATIENT_CLINIC_OR_DEPARTMENT_OTHER): Payer: Self-pay | Admitting: Obstetrics and Gynecology

## 2023-02-20 ENCOUNTER — Other Ambulatory Visit: Payer: Self-pay | Admitting: Obstetrics and Gynecology

## 2023-02-20 DIAGNOSIS — Z803 Family history of malignant neoplasm of breast: Secondary | ICD-10-CM

## 2023-04-04 ENCOUNTER — Other Ambulatory Visit: Payer: BC Managed Care – PPO

## 2023-04-11 ENCOUNTER — Encounter: Payer: Self-pay | Admitting: Obstetrics and Gynecology

## 2023-04-14 ENCOUNTER — Ambulatory Visit
Admission: RE | Admit: 2023-04-14 | Discharge: 2023-04-14 | Disposition: A | Discharge status: Home / Self Care | Payer: BC Managed Care – PPO | Source: Ambulatory Visit | Attending: Obstetrics and Gynecology | Admitting: Obstetrics and Gynecology

## 2023-04-14 DIAGNOSIS — Z803 Family history of malignant neoplasm of breast: Secondary | ICD-10-CM

## 2023-04-14 MED ORDER — GADOPICLENOL 0.5 MMOL/ML IV SOLN
9.0000 mL | Freq: Once | INTRAVENOUS | Status: AC | PRN
  Administered 2023-04-14: 9 mL via INTRAVENOUS

## 2023-09-02 ENCOUNTER — Other Ambulatory Visit: Payer: Self-pay | Admitting: Obstetrics and Gynecology

## 2023-09-02 DIAGNOSIS — Z1231 Encounter for screening mammogram for malignant neoplasm of breast: Secondary | ICD-10-CM

## 2023-10-21 ENCOUNTER — Ambulatory Visit
Admission: RE | Admit: 2023-10-21 | Discharge: 2023-10-21 | Disposition: A | Discharge status: Home / Self Care | Payer: 59 | Source: Ambulatory Visit | Attending: Obstetrics and Gynecology | Admitting: Obstetrics and Gynecology

## 2023-10-21 DIAGNOSIS — Z1231 Encounter for screening mammogram for malignant neoplasm of breast: Secondary | ICD-10-CM

## 2024-04-26 ENCOUNTER — Encounter: Payer: Self-pay | Admitting: Obstetrics and Gynecology

## 2024-04-26 ENCOUNTER — Other Ambulatory Visit: Payer: Self-pay | Admitting: Obstetrics and Gynecology

## 2024-04-26 DIAGNOSIS — Z803 Family history of malignant neoplasm of breast: Secondary | ICD-10-CM

## 2024-05-06 ENCOUNTER — Other Ambulatory Visit

## 2024-06-15 ENCOUNTER — Other Ambulatory Visit

## 2024-06-26 ENCOUNTER — Other Ambulatory Visit

## 2024-07-18 ENCOUNTER — Ambulatory Visit
Admission: RE | Admit: 2024-07-18 | Discharge: 2024-07-18 | Disposition: A | Discharge status: Home / Self Care | Source: Ambulatory Visit | Attending: Obstetrics and Gynecology | Admitting: Obstetrics and Gynecology

## 2024-07-18 DIAGNOSIS — Z803 Family history of malignant neoplasm of breast: Secondary | ICD-10-CM

## 2024-07-18 MED ORDER — GADOPICLENOL 0.5 MMOL/ML IV SOLN
8.0000 mL | Freq: Once | INTRAVENOUS | Status: AC | PRN
  Administered 2024-07-18: 8 mL via INTRAVENOUS

## 2024-07-19 ENCOUNTER — Other Ambulatory Visit: Payer: Self-pay | Admitting: Obstetrics and Gynecology

## 2024-07-19 DIAGNOSIS — R9389 Abnormal findings on diagnostic imaging of other specified body structures: Secondary | ICD-10-CM

## 2024-07-20 ENCOUNTER — Other Ambulatory Visit

## 2024-07-26 ENCOUNTER — Ambulatory Visit: Admission: RE | Admit: 2024-07-26 | Source: Ambulatory Visit

## 2024-07-26 ENCOUNTER — Other Ambulatory Visit: Payer: Self-pay | Admitting: Obstetrics and Gynecology

## 2024-07-26 ENCOUNTER — Ambulatory Visit
Admission: RE | Admit: 2024-07-26 | Discharge: 2024-07-26 | Disposition: A | Discharge status: Home / Self Care | Source: Ambulatory Visit | Attending: Obstetrics and Gynecology | Admitting: Obstetrics and Gynecology

## 2024-07-26 DIAGNOSIS — R9389 Abnormal findings on diagnostic imaging of other specified body structures: Secondary | ICD-10-CM

## 2024-07-26 MED ORDER — GADOPICLENOL 0.5 MMOL/ML IV SOLN
8.0000 mL | Freq: Once | INTRAVENOUS | Status: AC | PRN
  Administered 2024-07-26: 8 mL via INTRAVENOUS

## 2024-11-04 ENCOUNTER — Other Ambulatory Visit: Payer: Self-pay | Admitting: Obstetrics and Gynecology

## 2024-11-04 DIAGNOSIS — Z1231 Encounter for screening mammogram for malignant neoplasm of breast: Secondary | ICD-10-CM

## 2024-11-09 ENCOUNTER — Ambulatory Visit

## 2024-11-16 ENCOUNTER — Ambulatory Visit

## 2024-12-09 ENCOUNTER — Ambulatory Visit: Admitting: Family Medicine
